# Patient Record
Sex: Female | Born: 1990 | Race: White | Hispanic: No | Smoking: Current some day smoker
Health system: Southern US, Community
[De-identification: ages and names within clinical notes are randomized; demographics above are authoritative.]

## PROBLEM LIST (undated history)

## (undated) DIAGNOSIS — K8020 Calculus of gallbladder without cholecystitis without obstruction: Secondary | ICD-10-CM

## (undated) HISTORY — PX: CHOLECYSTECTOMY OPEN: SUR202

---

## 2015-07-10 ENCOUNTER — Inpatient Hospital Stay (HOSPITAL_COMMUNITY)
Admission: EM | Admit: 2015-07-10 | Discharge: 2015-07-20 | DRG: 441 | Disposition: A | Discharge status: Home / Self Care | Payer: No Typology Code available for payment source | Attending: Diagnostic Radiology | Admitting: Diagnostic Radiology

## 2015-07-10 ENCOUNTER — Encounter (HOSPITAL_COMMUNITY): Payer: Self-pay | Admitting: Emergency Medicine

## 2015-07-10 ENCOUNTER — Emergency Department (HOSPITAL_COMMUNITY): Payer: No Typology Code available for payment source

## 2015-07-10 DIAGNOSIS — S36116S Major laceration of liver, sequela: Secondary | ICD-10-CM

## 2015-07-10 DIAGNOSIS — S36113A Laceration of liver, unspecified degree, initial encounter: Principal | ICD-10-CM

## 2015-07-10 DIAGNOSIS — D62 Acute posthemorrhagic anemia: Secondary | ICD-10-CM | POA: Diagnosis present

## 2015-07-10 DIAGNOSIS — A599 Trichomoniasis, unspecified: Secondary | ICD-10-CM | POA: Diagnosis present

## 2015-07-10 DIAGNOSIS — N390 Urinary tract infection, site not specified: Secondary | ICD-10-CM | POA: Diagnosis present

## 2015-07-10 DIAGNOSIS — S31109S Unspecified open wound of abdominal wall, unspecified quadrant without penetration into peritoneal cavity, sequela: Secondary | ICD-10-CM

## 2015-07-10 DIAGNOSIS — K661 Hemoperitoneum: Secondary | ICD-10-CM | POA: Diagnosis not present

## 2015-07-10 DIAGNOSIS — R17 Unspecified jaundice: Secondary | ICD-10-CM | POA: Diagnosis present

## 2015-07-10 DIAGNOSIS — R509 Fever, unspecified: Secondary | ICD-10-CM

## 2015-07-10 DIAGNOSIS — T1490XA Injury, unspecified, initial encounter: Secondary | ICD-10-CM

## 2015-07-10 DIAGNOSIS — Z781 Physical restraint status: Secondary | ICD-10-CM | POA: Diagnosis not present

## 2015-07-10 DIAGNOSIS — S060X9A Concussion with loss of consciousness of unspecified duration, initial encounter: Secondary | ICD-10-CM | POA: Diagnosis present

## 2015-07-10 DIAGNOSIS — R0602 Shortness of breath: Secondary | ICD-10-CM

## 2015-07-10 DIAGNOSIS — F17200 Nicotine dependence, unspecified, uncomplicated: Secondary | ICD-10-CM | POA: Diagnosis present

## 2015-07-10 DIAGNOSIS — R188 Other ascites: Secondary | ICD-10-CM

## 2015-07-10 DIAGNOSIS — K567 Ileus, unspecified: Secondary | ICD-10-CM

## 2015-07-10 DIAGNOSIS — Y92410 Unspecified street and highway as the place of occurrence of the external cause: Secondary | ICD-10-CM

## 2015-07-10 DIAGNOSIS — S060XAA Concussion with loss of consciousness status unknown, initial encounter: Secondary | ICD-10-CM | POA: Diagnosis present

## 2015-07-10 DIAGNOSIS — R109 Unspecified abdominal pain: Secondary | ICD-10-CM | POA: Diagnosis present

## 2015-07-10 DIAGNOSIS — S36116A Major laceration of liver, initial encounter: Secondary | ICD-10-CM

## 2015-07-10 HISTORY — DX: Calculus of gallbladder without cholecystitis without obstruction: K80.20

## 2015-07-10 LAB — DIFFERENTIAL
Basophils Absolute: 0.1 10*3/uL (ref 0.0–0.1)
Basophils Relative: 0 %
EOS PCT: 1 %
Eosinophils Absolute: 0.2 10*3/uL (ref 0.0–0.7)
LYMPHS ABS: 6.2 10*3/uL — AB (ref 0.7–4.0)
LYMPHS PCT: 20 %
MONO ABS: 1.3 10*3/uL — AB (ref 0.1–1.0)
MONOS PCT: 4 %
Neutro Abs: 22.8 10*3/uL — ABNORMAL HIGH (ref 1.7–7.7)
Neutrophils Relative %: 75 %

## 2015-07-10 LAB — URINALYSIS, ROUTINE W REFLEX MICROSCOPIC
BILIRUBIN URINE: NEGATIVE
GLUCOSE, UA: 100 mg/dL — AB
KETONES UR: NEGATIVE mg/dL
Leukocytes, UA: NEGATIVE
Nitrite: NEGATIVE
PH: 7 (ref 5.0–8.0)
Protein, ur: 30 mg/dL — AB
Specific Gravity, Urine: >1.046 — ABNORMAL HIGH (ref 1.005–1.030)

## 2015-07-10 LAB — COMPREHENSIVE METABOLIC PANEL
ALT: 543 U/L — AB (ref 14–54)
AST: 666 U/L — AB (ref 15–41)
Albumin: 3.5 g/dL (ref 3.5–5.0)
Alkaline Phosphatase: 102 U/L (ref 38–126)
Anion gap: 13 (ref 5–15)
BUN: 14 mg/dL (ref 6–20)
CHLORIDE: 105 mmol/L (ref 101–111)
CO2: 21 mmol/L — AB (ref 22–32)
CREATININE: 1.29 mg/dL — AB (ref 0.44–1.00)
Calcium: 8.5 mg/dL — ABNORMAL LOW (ref 8.9–10.3)
GFR calc Af Amer: >60 mL/min (ref >60)
GFR calc non Af Amer: 57 mL/min — ABNORMAL LOW (ref >60)
GLUCOSE: 306 mg/dL — AB (ref 65–99)
Potassium: 3.5 mmol/L (ref 3.5–5.1)
SODIUM: 139 mmol/L (ref 135–145)
Total Bilirubin: 1 mg/dL (ref 0.3–1.2)
Total Protein: 6.1 g/dL — ABNORMAL LOW (ref 6.5–8.1)

## 2015-07-10 LAB — CBC
HCT: 36.6 % (ref 36.0–46.0)
HEMATOCRIT: 31.5 % — AB (ref 36.0–46.0)
HEMOGLOBIN: 10.2 g/dL — AB (ref 12.0–15.0)
Hemoglobin: 11.5 g/dL — ABNORMAL LOW (ref 12.0–15.0)
MCH: 32 pg (ref 26.0–34.0)
MCH: 33.2 pg (ref 26.0–34.0)
MCHC: 31.4 g/dL (ref 30.0–36.0)
MCHC: 32.4 g/dL (ref 30.0–36.0)
MCV: 101.9 fL — AB (ref 78.0–100.0)
MCV: 102.6 fL — AB (ref 78.0–100.0)
PLATELETS: 1071 10*3/uL — AB (ref 150–400)
Platelets: 270 10*3/uL (ref 150–400)
RBC: 3.59 MIL/uL — ABNORMAL LOW (ref 3.87–5.11)
RBC: 3.07 MIL/uL — ABNORMAL LOW (ref 3.87–5.11)
RDW: 14 % (ref 11.5–15.5)
RDW: 14.1 % (ref 11.5–15.5)
WBC: 30.5 10*3/uL — ABNORMAL HIGH (ref 4.0–10.5)
WBC: 24.5 10*3/uL — AB (ref 4.0–10.5)

## 2015-07-10 LAB — PROTIME-INR
INR: 1.18 (ref 0.00–1.49)
PROTHROMBIN TIME: 15.2 s (ref 11.6–15.2)

## 2015-07-10 LAB — I-STAT CHEM 8, ED
BUN: 17 mg/dL (ref 6–20)
CREATININE: 1.1 mg/dL — AB (ref 0.44–1.00)
Calcium, Ion: 0.99 mmol/L — ABNORMAL LOW (ref 1.12–1.23)
Chloride: 104 mmol/L (ref 101–111)
Glucose, Bld: 294 mg/dL — ABNORMAL HIGH (ref 65–99)
HEMATOCRIT: 40 % (ref 36.0–46.0)
HEMOGLOBIN: 13.6 g/dL (ref 12.0–15.0)
POTASSIUM: 3.6 mmol/L (ref 3.5–5.1)
Sodium: 140 mmol/L (ref 135–145)
TCO2: 23 mmol/L (ref 0–100)

## 2015-07-10 LAB — SAMPLE TO BLOOD BANK

## 2015-07-10 LAB — I-STAT CG4 LACTIC ACID, ED: Lactic Acid, Venous: 5.58 mmol/L (ref 0.5–2.0)

## 2015-07-10 LAB — URINE MICROSCOPIC-ADD ON

## 2015-07-10 LAB — ABO/RH: ABO/RH(D): O POS

## 2015-07-10 LAB — PREPARE RBC (CROSSMATCH)

## 2015-07-10 LAB — CDS SEROLOGY

## 2015-07-10 LAB — ETHANOL: Alcohol, Ethyl (B): <5 mg/dL (ref <5)

## 2015-07-10 MED ORDER — HYDROMORPHONE HCL 1 MG/ML IJ SOLN
INTRAMUSCULAR | Status: AC
  Filled 2015-07-10: qty 1

## 2015-07-10 MED ORDER — DEXTROSE IN LACTATED RINGERS 5 % IV SOLN
INTRAVENOUS | Status: DC
  Administered 2015-07-10 – 2015-07-14 (×5): via INTRAVENOUS

## 2015-07-10 MED ORDER — DOCUSATE SODIUM 100 MG PO CAPS
100.0000 mg | ORAL_CAPSULE | Freq: Two times a day (BID) | ORAL | Status: DC
  Administered 2015-07-11 – 2015-07-20 (×12): 100 mg via ORAL
  Filled 2015-07-10 (×15): qty 1

## 2015-07-10 MED ORDER — ONDANSETRON HCL 4 MG/2ML IJ SOLN
4.0000 mg | Freq: Four times a day (QID) | INTRAMUSCULAR | Status: DC | PRN
  Administered 2015-07-11: 4 mg via INTRAVENOUS
  Filled 2015-07-10: qty 2

## 2015-07-10 MED ORDER — HYDROMORPHONE HCL 1 MG/ML IJ SOLN
1.0000 mg | Freq: Once | INTRAMUSCULAR | Status: AC
  Administered 2015-07-10: 1 mg via INTRAVENOUS

## 2015-07-10 MED ORDER — PANTOPRAZOLE SODIUM 40 MG IV SOLR
40.0000 mg | Freq: Every day | INTRAVENOUS | Status: DC
  Administered 2015-07-10: 40 mg via INTRAVENOUS
  Filled 2015-07-10: qty 40

## 2015-07-10 MED ORDER — MORPHINE SULFATE (PF) 4 MG/ML IV SOLN
4.0000 mg | Freq: Once | INTRAVENOUS | Status: AC
  Administered 2015-07-10: 4 mg via INTRAVENOUS
  Filled 2015-07-10: qty 1

## 2015-07-10 MED ORDER — MORPHINE SULFATE (PF) 4 MG/ML IV SOLN
4.0000 mg | Freq: Once | INTRAVENOUS | Status: AC
Start: 2015-07-10 — End: 2015-07-10
  Administered 2015-07-10: 4 mg via INTRAVENOUS
  Filled 2015-07-10: qty 1

## 2015-07-10 MED ORDER — ONDANSETRON HCL 4 MG/2ML IJ SOLN
4.0000 mg | Freq: Four times a day (QID) | INTRAMUSCULAR | Status: DC | PRN
  Administered 2015-07-12 – 2015-07-15 (×6): 4 mg via INTRAVENOUS
  Filled 2015-07-10 (×6): qty 2

## 2015-07-10 MED ORDER — SODIUM CHLORIDE 0.9 % IV BOLUS (SEPSIS)
1000.0000 mL | Freq: Once | INTRAVENOUS | Status: AC
  Administered 2015-07-10: 1000 mL via INTRAVENOUS

## 2015-07-10 MED ORDER — LORAZEPAM 2 MG/ML IJ SOLN
1.0000 mg | Freq: Once | INTRAMUSCULAR | Status: AC
  Administered 2015-07-10: 1 mg via INTRAVENOUS
  Filled 2015-07-10: qty 1

## 2015-07-10 MED ORDER — ESMOLOL HCL-SODIUM CHLORIDE 2000 MG/100ML IV SOLN
25.0000 ug/kg/min | INTRAVENOUS | Status: DC
  Administered 2015-07-10 – 2015-07-11 (×2): 25 ug/kg/min via INTRAVENOUS
  Filled 2015-07-10 (×2): qty 100

## 2015-07-10 MED ORDER — SODIUM CHLORIDE 0.9% FLUSH: 9.0000 mL | INTRAVENOUS | Status: DC | PRN

## 2015-07-10 MED ORDER — PANTOPRAZOLE SODIUM 40 MG PO TBEC
40.0000 mg | DELAYED_RELEASE_TABLET | Freq: Every day | ORAL | Status: DC
  Administered 2015-07-11 – 2015-07-20 (×9): 40 mg via ORAL
  Filled 2015-07-10 (×9): qty 1

## 2015-07-10 MED ORDER — DIPHENHYDRAMINE HCL 12.5 MG/5ML PO ELIX: 12.5000 mg | ORAL_SOLUTION | Freq: Four times a day (QID) | ORAL | Status: DC | PRN

## 2015-07-10 MED ORDER — IOPAMIDOL (ISOVUE-300) INJECTION 61%
INTRAVENOUS | Status: AC
  Administered 2015-07-10: 100 mL
  Filled 2015-07-10: qty 100

## 2015-07-10 MED ORDER — ONDANSETRON HCL 4 MG PO TABS
4.0000 mg | ORAL_TABLET | Freq: Four times a day (QID) | ORAL | Status: DC | PRN
  Filled 2015-07-10: qty 1

## 2015-07-10 MED ORDER — HYDROMORPHONE HCL 1 MG/ML IJ SOLN
0.5000 mg | INTRAMUSCULAR | Status: DC | PRN
  Administered 2015-07-10 (×2): 2 mg via INTRAVENOUS
  Filled 2015-07-10 (×2): qty 2

## 2015-07-10 MED ORDER — DIPHENHYDRAMINE HCL 50 MG/ML IJ SOLN
12.5000 mg | Freq: Four times a day (QID) | INTRAMUSCULAR | Status: DC | PRN
Start: 2015-07-10 — End: 2015-07-13

## 2015-07-10 MED ORDER — HYDROMORPHONE 1 MG/ML IV SOLN
INTRAVENOUS | Status: DC
  Administered 2015-07-11: 01:00:00 via INTRAVENOUS
  Administered 2015-07-11: 1.5 mg via INTRAVENOUS
  Administered 2015-07-11: 4.5 mg via INTRAVENOUS
  Administered 2015-07-11: 2.6 mg via INTRAVENOUS
  Administered 2015-07-11 (×2): 2.1 mg via INTRAVENOUS
  Administered 2015-07-12: 1.5 mg via INTRAVENOUS
  Administered 2015-07-12: 3.3 mg via INTRAVENOUS
  Administered 2015-07-12: 2.7 mg via INTRAVENOUS
  Administered 2015-07-12: 3.6 mg via INTRAVENOUS
  Administered 2015-07-12: 1.25 mg via INTRAVENOUS
  Administered 2015-07-12: 11:00:00 via INTRAVENOUS
  Administered 2015-07-12 – 2015-07-13 (×2): 2.4 mg via INTRAVENOUS
  Administered 2015-07-13: 2.1 mg via INTRAVENOUS
  Administered 2015-07-13: 4.2 mg via INTRAVENOUS
  Administered 2015-07-13: 1.5 mg via INTRAVENOUS
  Administered 2015-07-13: 6.91 mg via INTRAVENOUS
  Filled 2015-07-10 (×2): qty 25

## 2015-07-10 MED ORDER — NALOXONE HCL 0.4 MG/ML IJ SOLN: 0.4000 mg | INTRAMUSCULAR | Status: DC | PRN

## 2015-07-10 MED ORDER — SODIUM CHLORIDE 0.9 % IV SOLN: 10.0000 mL/h | Freq: Once | INTRAVENOUS | Status: DC

## 2015-07-10 MED ORDER — NICOTINE 21 MG/24HR TD PT24
21.0000 mg | MEDICATED_PATCH | Freq: Once | TRANSDERMAL | Status: AC
  Administered 2015-07-10: 21 mg via TRANSDERMAL
  Filled 2015-07-10: qty 1

## 2015-07-10 NOTE — ED Notes (Signed)
Pt very agitated, RN continuously needed to be encouraged to stay in bed, reports "bad pain"
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
## 2015-07-10 NOTE — ED Notes (Signed)
Pt pulled off C collar. Pt out of bed stating she has to pee. Pt placed back in bed, C collar applied.
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
## 2015-07-10 NOTE — ED Notes (Signed)
Pt returned from CT °

## 2015-07-10 NOTE — Progress Notes (Signed)
F[facility] of [facility] is now in [facility]age...asked when he could visit patient.  RN indicated that he should finish triage and room assignment, then ask to visit with her if possible.  Chaplain communicated information to fiance, who is accompanied by his mother.  Rev. BroseleyJan Hill, [place]Chaplain [phone]
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
## 2015-07-10 NOTE — ED Notes (Signed)
X-ray at bedside

## 2015-07-10 NOTE — ED Notes (Signed)
Pt appears pale at this time. No bruising noted on abdomen.

## 2015-07-10 NOTE — ED Notes (Signed)
Pt in CT.

## 2015-07-10 NOTE — ED Notes (Addendum)
Critical platelet 1071 level. MD notified. Dr. Silverio LayYao notified. Added on Differential.

## 2015-07-10 NOTE — ED Notes (Signed)
Per Dr. Silverio LayYao, to hold giving blood. To type and cross for four units and have them ready

## 2015-07-10 NOTE — ED Notes (Signed)
Attempted to chart Trauma End, unable to at this time.

## 2015-07-11 LAB — CBC
HEMATOCRIT: 33.4 % — AB (ref 36.0–46.0)
HEMATOCRIT: 31.6 % — AB (ref 36.0–46.0)
HEMOGLOBIN: 10.8 g/dL — AB (ref 12.0–15.0)
Hemoglobin: 10.1 g/dL — ABNORMAL LOW (ref 12.0–15.0)
MCH: 32.7 pg (ref 26.0–34.0)
MCH: 32.5 pg (ref 26.0–34.0)
MCHC: 32.3 g/dL (ref 30.0–36.0)
MCHC: 32 g/dL (ref 30.0–36.0)
MCV: 101.2 fL — ABNORMAL HIGH (ref 78.0–100.0)
MCV: 101.6 fL — AB (ref 78.0–100.0)
PLATELETS: 232 10*3/uL (ref 150–400)
Platelets: 191 10*3/uL (ref 150–400)
RBC: 3.3 MIL/uL — ABNORMAL LOW (ref 3.87–5.11)
RBC: 3.11 MIL/uL — AB (ref 3.87–5.11)
RDW: 14.3 % (ref 11.5–15.5)
RDW: 14.5 % (ref 11.5–15.5)
WBC: 19.1 10*3/uL — ABNORMAL HIGH (ref 4.0–10.5)
WBC: 14.7 10*3/uL — AB (ref 4.0–10.5)

## 2015-07-11 LAB — COMPREHENSIVE METABOLIC PANEL
ALK PHOS: 77 U/L (ref 38–126)
ALT: 621 U/L — ABNORMAL HIGH (ref 14–54)
ANION GAP: 7 (ref 5–15)
AST: 701 U/L — ABNORMAL HIGH (ref 15–41)
Albumin: 3.2 g/dL — ABNORMAL LOW (ref 3.5–5.0)
BILIRUBIN TOTAL: 0.6 mg/dL (ref 0.3–1.2)
BUN: 13 mg/dL (ref 6–20)
CALCIUM: 7.9 mg/dL — AB (ref 8.9–10.3)
CO2: 22 mmol/L (ref 22–32)
Chloride: 112 mmol/L — ABNORMAL HIGH (ref 101–111)
Creatinine, Ser: 0.97 mg/dL (ref 0.44–1.00)
GFR calc non Af Amer: >60 mL/min (ref >60)
Glucose, Bld: 187 mg/dL — ABNORMAL HIGH (ref 65–99)
POTASSIUM: 5 mmol/L (ref 3.5–5.1)
Sodium: 141 mmol/L (ref 135–145)
TOTAL PROTEIN: 5.4 g/dL — AB (ref 6.5–8.1)

## 2015-07-11 LAB — MRSA PCR SCREENING: MRSA BY PCR: NEGATIVE

## 2015-07-11 LAB — PROTIME-INR
INR: 1.16 (ref 0.00–1.49)
PROTHROMBIN TIME: 15 s (ref 11.6–15.2)

## 2015-07-11 LAB — PATHOLOGIST SMEAR REVIEW

## 2015-07-11 MED ORDER — METOPROLOL TARTRATE 5 MG/5ML IV SOLN
10.0000 mg | Freq: Four times a day (QID) | INTRAVENOUS | Status: DC
  Administered 2015-07-11 – 2015-07-13 (×10): 10 mg via INTRAVENOUS
  Filled 2015-07-11 (×11): qty 10

## 2015-07-11 MED ORDER — PROCHLORPERAZINE EDISYLATE 5 MG/ML IJ SOLN
5.0000 mg | Freq: Once | INTRAMUSCULAR | Status: AC
  Administered 2015-07-11: 5 mg via INTRAVENOUS
  Filled 2015-07-11: qty 2
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
## 2015-07-12 LAB — BASIC METABOLIC PANEL
Anion gap: 8 (ref 5–15)
BUN: 12 mg/dL (ref 6–20)
CHLORIDE: 106 mmol/L (ref 101–111)
CO2: 22 mmol/L (ref 22–32)
Calcium: 8.2 mg/dL — ABNORMAL LOW (ref 8.9–10.3)
Creatinine, Ser: 0.95 mg/dL (ref 0.44–1.00)
GFR calc Af Amer: >60 mL/min (ref >60)
GFR calc non Af Amer: >60 mL/min (ref >60)
GLUCOSE: 168 mg/dL — AB (ref 65–99)
Potassium: 4.5 mmol/L (ref 3.5–5.1)
Sodium: 136 mmol/L (ref 135–145)

## 2015-07-12 LAB — URINE MICROSCOPIC-ADD ON

## 2015-07-12 LAB — CBC WITH DIFFERENTIAL/PLATELET
Basophils Absolute: 0 10*3/uL (ref 0.0–0.1)
Basophils Relative: 0 %
EOS ABS: 0 10*3/uL (ref 0.0–0.7)
Eosinophils Relative: 0 %
HEMATOCRIT: 27.6 % — AB (ref 36.0–46.0)
HEMOGLOBIN: 8.8 g/dL — AB (ref 12.0–15.0)
LYMPHS ABS: 1.9 10*3/uL (ref 0.7–4.0)
LYMPHS PCT: 13 %
MCH: 32.8 pg (ref 26.0–34.0)
MCHC: 31.9 g/dL (ref 30.0–36.0)
MCV: 103 fL — AB (ref 78.0–100.0)
MONOS PCT: 8 %
Monocytes Absolute: 1.2 10*3/uL — ABNORMAL HIGH (ref 0.1–1.0)
NEUTROS ABS: 11.7 10*3/uL — AB (ref 1.7–7.7)
NEUTROS PCT: 79 %
Platelets: 203 10*3/uL (ref 150–400)
RBC: 2.68 MIL/uL — AB (ref 3.87–5.11)
RDW: 14.6 % (ref 11.5–15.5)
WBC: 14.8 10*3/uL — AB (ref 4.0–10.5)

## 2015-07-12 LAB — CBC
HEMATOCRIT: 26.6 % — AB (ref 36.0–46.0)
Hemoglobin: 8.5 g/dL — ABNORMAL LOW (ref 12.0–15.0)
MCH: 32.7 pg (ref 26.0–34.0)
MCHC: 32 g/dL (ref 30.0–36.0)
MCV: 102.3 fL — AB (ref 78.0–100.0)
PLATELETS: 216 10*3/uL (ref 150–400)
RBC: 2.6 MIL/uL — ABNORMAL LOW (ref 3.87–5.11)
RDW: 14.5 % (ref 11.5–15.5)
WBC: 16.2 10*3/uL — AB (ref 4.0–10.5)

## 2015-07-12 LAB — URINALYSIS, ROUTINE W REFLEX MICROSCOPIC
GLUCOSE, UA: NEGATIVE mg/dL
KETONES UR: 15 mg/dL — AB
Nitrite: POSITIVE — AB
PH: 5.5 (ref 5.0–8.0)
PROTEIN: 30 mg/dL — AB
Specific Gravity, Urine: 1.029 (ref 1.005–1.030)

## 2015-07-12 MED ORDER — SODIUM CHLORIDE 0.9 % IV BOLUS (SEPSIS)
500.0000 mL | Freq: Once | INTRAVENOUS | Status: AC
  Administered 2015-07-12: 500 mL via INTRAVENOUS

## 2015-07-12 NOTE — Progress Notes (Signed)
At shift change (approx. 1930) pt endorsed the urge to urinate but an inability to do so. Bladder Scan indicated 428mL of urine and I and O attempted. 75mL recovered but post cath bladder scan indicated more urine (550mL). Pt still endorsed need to void but desired replacement of Foley due to difficulty with initial I and O Cath (pain, seeming inability to drain bladder). Dr. Davina Pokeornet on-call for Trauma paged and ordered received for Foley. Foley placed per established procedure and drains appropriately but only marginal output (225mL amber, cloudy urine) since placement. Pt no longer endorses desire to void but bladder scan remains in the 400-56800mL range. This RN unable to palpate bladder. Will convey to day RN/day team and continue to monitor.
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
## 2015-07-12 NOTE — Progress Notes (Signed)
@  2145 Dr. Derrell Lollingamirez on-call for Trauma paged regarding pt's decreased UOP (215mL preceding 12 hours, 515mL preceding 24 hours), UA indicating possible UTI and potential need for antibiotics, abdominal distention (mildly worse than my previous shift with pt), and pt's complaints of Indigestion (requesting antacid). Orders received for urine culture (antibiotics deferred until results of culture) and increased fluid rate (4375mL/hr up from 50mL./hr). No other orders received. Will continue to monitor and assess.

## 2015-07-13 ENCOUNTER — Inpatient Hospital Stay (HOSPITAL_COMMUNITY): Payer: No Typology Code available for payment source

## 2015-07-13 LAB — TYPE AND SCREEN
ABO/RH(D): O POS
ANTIBODY SCREEN: NEGATIVE
UNIT DIVISION: 0
UNIT DIVISION: 0
UNIT DIVISION: 0
Unit division: 0

## 2015-07-13 LAB — CBC
HEMATOCRIT: 23.2 % — AB (ref 36.0–46.0)
Hemoglobin: 7.7 g/dL — ABNORMAL LOW (ref 12.0–15.0)
MCH: 33.8 pg (ref 26.0–34.0)
MCHC: 33.2 g/dL (ref 30.0–36.0)
MCV: 101.8 fL — AB (ref 78.0–100.0)
Platelets: 206 10*3/uL (ref 150–400)
RBC: 2.28 MIL/uL — ABNORMAL LOW (ref 3.87–5.11)
RDW: 14.6 % (ref 11.5–15.5)
WBC: 13.2 10*3/uL — ABNORMAL HIGH (ref 4.0–10.5)

## 2015-07-13 LAB — PREPARE RBC (CROSSMATCH)

## 2015-07-13 MED ORDER — FENTANYL 25 MCG/HR TD PT72
50.0000 ug | MEDICATED_PATCH | TRANSDERMAL | Status: DC
  Administered 2015-07-13 – 2015-07-16 (×2): 50 ug via TRANSDERMAL
  Filled 2015-07-13 (×2): qty 2

## 2015-07-13 MED ORDER — SODIUM CHLORIDE 0.9 % IV SOLN
500.0000 mL | Freq: Once | INTRAVENOUS | Status: AC
  Administered 2015-07-13: 500 mL via INTRAVENOUS

## 2015-07-13 MED ORDER — BETHANECHOL CHLORIDE 10 MG PO TABS
10.0000 mg | ORAL_TABLET | Freq: Three times a day (TID) | ORAL | Status: DC
  Administered 2015-07-13 – 2015-07-19 (×18): 10 mg via ORAL
  Filled 2015-07-13 (×18): qty 1

## 2015-07-13 MED ORDER — METOCLOPRAMIDE HCL 5 MG/ML IJ SOLN
5.0000 mg | Freq: Three times a day (TID) | INTRAMUSCULAR | Status: AC | PRN
  Administered 2015-07-13: 5 mg via INTRAVENOUS
  Filled 2015-07-13: qty 2

## 2015-07-13 MED ORDER — MORPHINE SULFATE (PF) 2 MG/ML IV SOLN
2.0000 mg | INTRAVENOUS | Status: DC | PRN
  Administered 2015-07-13 (×2): 2 mg via INTRAVENOUS
  Administered 2015-07-13: 4 mg via INTRAVENOUS
  Filled 2015-07-13: qty 2
  Filled 2015-07-13 (×2): qty 1

## 2015-07-13 MED ORDER — CIPROFLOXACIN IN D5W 400 MG/200ML IV SOLN
400.0000 mg | Freq: Two times a day (BID) | INTRAVENOUS | Status: AC
  Administered 2015-07-13 – 2015-07-16 (×6): 400 mg via INTRAVENOUS
  Filled 2015-07-13 (×6): qty 200

## 2015-07-13 MED ORDER — PROCHLORPERAZINE EDISYLATE 5 MG/ML IJ SOLN
10.0000 mg | INTRAMUSCULAR | Status: DC | PRN
  Administered 2015-07-13: 10 mg via INTRAVENOUS
  Filled 2015-07-13 (×2): qty 2

## 2015-07-13 MED ORDER — SODIUM CHLORIDE 0.9 % IV SOLN
Freq: Once | INTRAVENOUS | Status: AC
  Administered 2015-07-13: 17:00:00 via INTRAVENOUS

## 2015-07-13 MED ORDER — CIPROFLOXACIN IN D5W 400 MG/200ML IV SOLN
400.0000 mg | Freq: Two times a day (BID) | INTRAVENOUS | Status: DC
  Filled 2015-07-13 (×2): qty 200

## 2015-07-13 MED ORDER — ALBUMIN HUMAN 5 % IV SOLN
25.0000 g | Freq: Once | INTRAVENOUS | Status: AC
  Administered 2015-07-14: 25 g via INTRAVENOUS
  Filled 2015-07-13: qty 250
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
## 2015-07-13 NOTE — Progress Notes (Signed)
Urine output is down significantly, BUN and creatinine are normal.  Bladder scan is high in spite of catheter in place.  Likely scanner detecting fluid outside of the bladder.  The plain abdominal film suggest significant ascites.  I will irrigate the patient's Foley myself, and if that shows proper positioning I will order CT scan of the abdomen for the morning.  She may have a bile leak and may need drainage.  I have re-examined the patient and the catheter is in the right place.  She is intravascularly dry.  I did not irrigate her bladder.  Will give a bolus of saline and albumin  Marta LamasJames O. Gae BonWyatt, III, MD, FACS [phone] Trauma Surgeon

## 2015-07-14 ENCOUNTER — Inpatient Hospital Stay (HOSPITAL_COMMUNITY): Payer: No Typology Code available for payment source

## 2015-07-14 DIAGNOSIS — S060X9A Concussion with loss of consciousness of unspecified duration, initial encounter: Secondary | ICD-10-CM | POA: Diagnosis present

## 2015-07-14 DIAGNOSIS — D62 Acute posthemorrhagic anemia: Secondary | ICD-10-CM | POA: Diagnosis not present

## 2015-07-14 DIAGNOSIS — N390 Urinary tract infection, site not specified: Secondary | ICD-10-CM | POA: Diagnosis not present

## 2015-07-14 DIAGNOSIS — S060XAA Concussion with loss of consciousness status unknown, initial encounter: Secondary | ICD-10-CM | POA: Diagnosis present

## 2015-07-14 DIAGNOSIS — K567 Ileus, unspecified: Secondary | ICD-10-CM | POA: Diagnosis not present

## 2015-07-14 LAB — CBC WITH DIFFERENTIAL/PLATELET
BASOS ABS: 0 10*3/uL (ref 0.0–0.1)
BASOS PCT: 0 %
EOS ABS: 0 10*3/uL (ref 0.0–0.7)
EOS PCT: 0 %
HCT: 23.5 % — ABNORMAL LOW (ref 36.0–46.0)
HEMOGLOBIN: 7.7 g/dL — AB (ref 12.0–15.0)
Lymphocytes Relative: 15 %
Lymphs Abs: 1.2 10*3/uL (ref 0.7–4.0)
MCH: 32.2 pg (ref 26.0–34.0)
MCHC: 32.8 g/dL (ref 30.0–36.0)
MCV: 98.3 fL (ref 78.0–100.0)
Monocytes Absolute: 1.2 10*3/uL — ABNORMAL HIGH (ref 0.1–1.0)
Monocytes Relative: 15 %
NEUTROS PCT: 70 %
Neutro Abs: 5.7 10*3/uL (ref 1.7–7.7)
PLATELETS: 155 10*3/uL (ref 150–400)
RBC: 2.39 MIL/uL — AB (ref 3.87–5.11)
RDW: 18 % — ABNORMAL HIGH (ref 11.5–15.5)
WBC: 8.1 10*3/uL (ref 4.0–10.5)

## 2015-07-14 LAB — BASIC METABOLIC PANEL
Anion gap: 7 (ref 5–15)
BUN: 6 mg/dL (ref 6–20)
CHLORIDE: 97 mmol/L — AB (ref 101–111)
CO2: 26 mmol/L (ref 22–32)
Calcium: 8 mg/dL — ABNORMAL LOW (ref 8.9–10.3)
Creatinine, Ser: 0.63 mg/dL (ref 0.44–1.00)
Glucose, Bld: 126 mg/dL — ABNORMAL HIGH (ref 65–99)
POTASSIUM: 3.4 mmol/L — AB (ref 3.5–5.1)
SODIUM: 130 mmol/L — AB (ref 135–145)

## 2015-07-14 LAB — URINE CULTURE: CULTURE: NO GROWTH

## 2015-07-14 LAB — PREPARE RBC (CROSSMATCH)

## 2015-07-14 MED ORDER — MORPHINE SULFATE (PF) 2 MG/ML IV SOLN
2.0000 mg | INTRAVENOUS | Status: DC | PRN
  Administered 2015-07-14: 4 mg via INTRAVENOUS
  Administered 2015-07-14 (×2): 2 mg via INTRAVENOUS
  Administered 2015-07-14: 4 mg via INTRAVENOUS
  Filled 2015-07-14 (×2): qty 2
  Filled 2015-07-14 (×3): qty 1

## 2015-07-14 MED ORDER — DIATRIZOATE MEGLUMINE & SODIUM 66-10 % PO SOLN
ORAL | Status: AC
  Filled 2015-07-14: qty 30

## 2015-07-14 MED ORDER — MORPHINE SULFATE (PF) 2 MG/ML IV SOLN
2.0000 mg | Freq: Once | INTRAVENOUS | Status: AC
  Administered 2015-07-14: 2 mg via INTRAVENOUS

## 2015-07-14 MED ORDER — POTASSIUM CHLORIDE 2 MEQ/ML IV SOLN
INTRAVENOUS | Status: DC
  Administered 2015-07-14 – 2015-07-18 (×9): via INTRAVENOUS
  Filled 2015-07-14 (×18): qty 1000

## 2015-07-14 MED ORDER — MORPHINE SULFATE (PF) 2 MG/ML IV SOLN
2.0000 mg | INTRAVENOUS | Status: DC | PRN
  Administered 2015-07-14 – 2015-07-17 (×9): 2 mg via INTRAVENOUS
  Filled 2015-07-14 (×9): qty 1

## 2015-07-14 MED ORDER — OXYCODONE HCL 5 MG PO TABS
10.0000 mg | ORAL_TABLET | ORAL | Status: DC | PRN
  Administered 2015-07-14: 20 mg via ORAL
  Administered 2015-07-14 (×3): 15 mg via ORAL
  Administered 2015-07-15 – 2015-07-16 (×6): 20 mg via ORAL
  Administered 2015-07-16: 10 mg via ORAL
  Administered 2015-07-18: 20 mg via ORAL
  Filled 2015-07-14 (×2): qty 3
  Filled 2015-07-14 (×3): qty 4
  Filled 2015-07-14: qty 3
  Filled 2015-07-14 (×6): qty 4

## 2015-07-14 MED ORDER — SODIUM CHLORIDE 0.9 % IV SOLN
Freq: Once | INTRAVENOUS | Status: AC
  Administered 2015-07-14: 13:00:00 via INTRAVENOUS

## 2015-07-14 MED ORDER — IOPAMIDOL (ISOVUE-300) INJECTION 61%
INTRAVENOUS | Status: AC
  Administered 2015-07-14: 100 mL
  Filled 2015-07-14: qty 100

## 2015-07-14 MED ORDER — LIDOCAINE HCL (PF) 1 % IJ SOLN
INTRAMUSCULAR | Status: AC
  Filled 2015-07-14: qty 10

## 2015-07-14 MED ORDER — POLYETHYLENE GLYCOL 3350 17 G PO PACK
17.0000 g | PACK | Freq: Every day | ORAL | Status: DC
  Administered 2015-07-14 – 2015-07-16 (×2): 17 g via ORAL
  Filled 2015-07-14 (×4): qty 1

## 2015-07-14 NOTE — Procedures (Signed)
US guided paracentesis, RLQ.  Removed 2 liters of bloody fluid.  No immediate complication.
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
## 2015-07-15 LAB — CBC
HCT: 28.7 % — ABNORMAL LOW (ref 36.0–46.0)
HEMOGLOBIN: 9.3 g/dL — AB (ref 12.0–15.0)
MCH: 31 pg (ref 26.0–34.0)
MCHC: 32.4 g/dL (ref 30.0–36.0)
MCV: 95.7 fL (ref 78.0–100.0)
Platelets: 193 10*3/uL (ref 150–400)
RBC: 3 MIL/uL — AB (ref 3.87–5.11)
RDW: 17.5 % — ABNORMAL HIGH (ref 11.5–15.5)
WBC: 8.4 10*3/uL (ref 4.0–10.5)

## 2015-07-15 LAB — BASIC METABOLIC PANEL
ANION GAP: 4 — AB (ref 5–15)
BUN: <5 mg/dL — ABNORMAL LOW (ref 6–20)
CHLORIDE: 102 mmol/L (ref 101–111)
CO2: 28 mmol/L (ref 22–32)
Calcium: 8.1 mg/dL — ABNORMAL LOW (ref 8.9–10.3)
Creatinine, Ser: 0.55 mg/dL (ref 0.44–1.00)
Glucose, Bld: 130 mg/dL — ABNORMAL HIGH (ref 65–99)
POTASSIUM: 4 mmol/L (ref 3.5–5.1)
SODIUM: 134 mmol/L — AB (ref 135–145)

## 2015-07-15 LAB — TYPE AND SCREEN
ABO/RH(D): O POS
Antibody Screen: NEGATIVE
UNIT DIVISION: 0
UNIT DIVISION: 0

## 2015-07-15 MED ORDER — LORAZEPAM 2 MG/ML IJ SOLN
1.0000 mg | Freq: Four times a day (QID) | INTRAMUSCULAR | Status: DC
  Administered 2015-07-15: 1 mg via INTRAVENOUS

## 2015-07-15 MED ORDER — TRAMADOL HCL 50 MG PO TABS
100.0000 mg | ORAL_TABLET | Freq: Four times a day (QID) | ORAL | Status: DC
  Administered 2015-07-15 – 2015-07-20 (×18): 100 mg via ORAL
  Filled 2015-07-15 (×19): qty 2

## 2015-07-15 MED ORDER — LORAZEPAM 2 MG/ML IJ SOLN
INTRAMUSCULAR | Status: AC
  Filled 2015-07-15: qty 1
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
## 2015-07-15 NOTE — Progress Notes (Signed)
Pt continues to move freely around the room unassisted and RN continually reminded patient that she is only allowed bathroom privileges. Pt states "I cannot sit still in my room all day". RN educated and will continue to monitor.

## 2015-07-15 NOTE — Progress Notes (Signed)
Bed alarm going off, upon assessment patient trying to get out of bed to go to the bathroom, education given on importance of bedrest with liver laceration. Patient states she understands, and will call for help next time. Bed alarm activated. Will continue to monitor.

## 2015-07-16 LAB — CBC
HCT: 29.4 % — ABNORMAL LOW (ref 36.0–46.0)
HEMOGLOBIN: 9.5 g/dL — AB (ref 12.0–15.0)
MCH: 31.1 pg (ref 26.0–34.0)
MCHC: 32.3 g/dL (ref 30.0–36.0)
MCV: 96.4 fL (ref 78.0–100.0)
PLATELETS: 221 10*3/uL (ref 150–400)
RBC: 3.05 MIL/uL — AB (ref 3.87–5.11)
RDW: 17 % — ABNORMAL HIGH (ref 11.5–15.5)
WBC: 10.1 10*3/uL (ref 4.0–10.5)

## 2015-07-16 MED ORDER — CLOTRIMAZOLE 2 % VA CREA
1.0000 | TOPICAL_CREAM | Freq: Every day | VAGINAL | Status: AC
  Administered 2015-07-16 – 2015-07-17 (×4): 1 via VAGINAL
  Filled 2015-07-16: qty 21

## 2015-07-16 MED ORDER — LORAZEPAM 2 MG/ML IJ SOLN
1.0000 mg | Freq: Four times a day (QID) | INTRAMUSCULAR | Status: DC | PRN
  Administered 2015-07-16: 1 mg via INTRAVENOUS

## 2015-07-16 MED ORDER — LORAZEPAM 2 MG/ML IJ SOLN
2.0000 mg | INTRAMUSCULAR | Status: DC | PRN
  Administered 2015-07-16: 3 mg via INTRAVENOUS
  Administered 2015-07-16 – 2015-07-20 (×6): 2 mg via INTRAVENOUS
  Filled 2015-07-16 (×6): qty 1
  Filled 2015-07-16: qty 2
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
## 2015-07-16 NOTE — Progress Notes (Signed)
Talked with Amina on medical team.  Explained that patient is either asleep and lethargic, or up taking herself off the monitors, going to bathroom unassisted.  Per night shift RN report, patient has been doing this all night, and now this morning.  New orders received.
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
## 2015-07-16 NOTE — Progress Notes (Addendum)
Pt continues to get OOB unassisted and disconnects herself from the monitor. Pt noted to be slurring her words occasionally, yet continues to request PRN pain meds. All prn meds held for now. RN will continue to monitor.

## 2015-07-16 NOTE — Progress Notes (Signed)
Pt continues to disconnect herself from her monitor and repeatedly asks RN when she can have her pain medicine. RN has told patient at 6am repeatedly and patient has no recollection of event. RN will continue to monitor.

## 2015-07-16 NOTE — Progress Notes (Signed)
Patient assisted to bathroom to void.  Pt stated she was feeling anxious, however, no outward signs of anxiety, no tremors noted.  Patient assisted back to bed and immediately fell back to sleep.

## 2015-07-16 NOTE — Progress Notes (Signed)
Chart reviewed.  Pt not able to participate in Ax.  SW to continue to follow.  Providence CrosbyAmanda Hilton Saephan, LCSW Clinical Social Work [phone]

## 2015-07-17 ENCOUNTER — Inpatient Hospital Stay (HOSPITAL_COMMUNITY): Payer: No Typology Code available for payment source

## 2015-07-17 DIAGNOSIS — R509 Fever, unspecified: Secondary | ICD-10-CM

## 2015-07-17 LAB — URINALYSIS, ROUTINE W REFLEX MICROSCOPIC
Glucose, UA: NEGATIVE mg/dL
HGB URINE DIPSTICK: NEGATIVE
KETONES UR: NEGATIVE mg/dL
NITRITE: NEGATIVE
PROTEIN: NEGATIVE mg/dL
SPECIFIC GRAVITY, URINE: 1.007 (ref 1.005–1.030)
pH: 8 (ref 5.0–8.0)

## 2015-07-17 LAB — URINE MICROSCOPIC-ADD ON

## 2015-07-17 LAB — COMPREHENSIVE METABOLIC PANEL
ALT: 285 U/L — AB (ref 14–54)
ANION GAP: 8 (ref 5–15)
AST: 89 U/L — ABNORMAL HIGH (ref 15–41)
Albumin: 2.1 g/dL — ABNORMAL LOW (ref 3.5–5.0)
Alkaline Phosphatase: 127 U/L — ABNORMAL HIGH (ref 38–126)
BUN: <5 mg/dL — ABNORMAL LOW (ref 6–20)
CALCIUM: 8.4 mg/dL — AB (ref 8.9–10.3)
CO2: 25 mmol/L (ref 22–32)
CREATININE: 0.58 mg/dL (ref 0.44–1.00)
Chloride: 97 mmol/L — ABNORMAL LOW (ref 101–111)
GFR calc non Af Amer: >60 mL/min (ref >60)
Glucose, Bld: 91 mg/dL (ref 65–99)
Potassium: 4.2 mmol/L (ref 3.5–5.1)
SODIUM: 130 mmol/L — AB (ref 135–145)
Total Bilirubin: 6.3 mg/dL — ABNORMAL HIGH (ref 0.3–1.2)
Total Protein: 5.2 g/dL — ABNORMAL LOW (ref 6.5–8.1)

## 2015-07-17 LAB — CBC
HCT: 29.2 % — ABNORMAL LOW (ref 36.0–46.0)
Hemoglobin: 9.4 g/dL — ABNORMAL LOW (ref 12.0–15.0)
MCH: 32 pg (ref 26.0–34.0)
MCHC: 32.2 g/dL (ref 30.0–36.0)
MCV: 99.3 fL (ref 78.0–100.0)
PLATELETS: 248 10*3/uL (ref 150–400)
RBC: 2.94 MIL/uL — AB (ref 3.87–5.11)
RDW: 17 % — ABNORMAL HIGH (ref 11.5–15.5)
WBC: 12.6 10*3/uL — AB (ref 4.0–10.5)

## 2015-07-17 LAB — AMMONIA: AMMONIA: 44 umol/L — AB (ref 9–35)

## 2015-07-17 MED ORDER — LACTULOSE 10 GM/15ML PO SOLN
20.0000 g | Freq: Two times a day (BID) | ORAL | Status: DC
  Administered 2015-07-17 – 2015-07-19 (×3): 20 g via ORAL
  Filled 2015-07-17 (×4): qty 30

## 2015-07-17 NOTE — Progress Notes (Signed)
While giving morning meds to patient, I saw one of the two fentanyl patches I placed to the patient's right upper arm and covered with a tegaderm yesterday, in the pocket of the side rail with a partial tegarderm covering with my initials.  When I asked the patient where the other patch was, she stated she "threw it in the trash yesterday".  I specifically asked her if she had swallowed it, and this she denied.  I notified NP Emina Riebock of the situation.
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
## 2015-07-17 NOTE — Progress Notes (Signed)
VASCULAR LAB PRELIMINARY  PRELIMINARY  PRELIMINARY  PRELIMINARY  Bilateral lower extremity venous duplex completed.    Preliminary report:  There is no DVT or SVT noted in the bilateral lower extremities.   Han Vejar, RVT 07/17/2015, 3:16 PM

## 2015-07-18 ENCOUNTER — Inpatient Hospital Stay (HOSPITAL_COMMUNITY): Payer: No Typology Code available for payment source

## 2015-07-18 LAB — COMPREHENSIVE METABOLIC PANEL
ALBUMIN: 2.1 g/dL — AB (ref 3.5–5.0)
ALK PHOS: 146 U/L — AB (ref 38–126)
ALT: 223 U/L — AB (ref 14–54)
AST: 77 U/L — AB (ref 15–41)
Anion gap: 8 (ref 5–15)
CALCIUM: 8.2 mg/dL — AB (ref 8.9–10.3)
CHLORIDE: 99 mmol/L — AB (ref 101–111)
CO2: 24 mmol/L (ref 22–32)
CREATININE: 0.7 mg/dL (ref 0.44–1.00)
GFR calc Af Amer: >60 mL/min (ref >60)
GFR calc non Af Amer: >60 mL/min (ref >60)
GLUCOSE: 130 mg/dL — AB (ref 65–99)
Potassium: 3.7 mmol/L (ref 3.5–5.1)
SODIUM: 131 mmol/L — AB (ref 135–145)
Total Bilirubin: 5.1 mg/dL — ABNORMAL HIGH (ref 0.3–1.2)
Total Protein: 5.7 g/dL — ABNORMAL LOW (ref 6.5–8.1)

## 2015-07-18 LAB — CBC
HCT: 31.9 % — ABNORMAL LOW (ref 36.0–46.0)
HEMOGLOBIN: 10.1 g/dL — AB (ref 12.0–15.0)
MCH: 31.3 pg (ref 26.0–34.0)
MCHC: 31.7 g/dL (ref 30.0–36.0)
MCV: 98.8 fL (ref 78.0–100.0)
PLATELETS: 222 10*3/uL (ref 150–400)
RBC: 3.23 MIL/uL — AB (ref 3.87–5.11)
RDW: 16.9 % — ABNORMAL HIGH (ref 11.5–15.5)
WBC: 15.4 10*3/uL — AB (ref 4.0–10.5)

## 2015-07-18 LAB — AMMONIA: Ammonia: 40 umol/L — ABNORMAL HIGH (ref 9–35)

## 2015-07-18 MED ORDER — MAGIC MOUTHWASH
5.0000 mL | Freq: Three times a day (TID) | ORAL | Status: DC | PRN
  Administered 2015-07-18: 5 mL via ORAL
  Filled 2015-07-18: qty 5

## 2015-07-18 MED ORDER — IOPAMIDOL (ISOVUE-300) INJECTION 61%
INTRAVENOUS | Status: AC
  Administered 2015-07-18: 100 mL
  Filled 2015-07-18: qty 100

## 2015-07-18 MED ORDER — ENSURE ENLIVE PO LIQD
237.0000 mL | Freq: Three times a day (TID) | ORAL | Status: DC
  Administered 2015-07-18 – 2015-07-20 (×4): 237 mL via ORAL

## 2015-07-18 MED ORDER — DIATRIZOATE MEGLUMINE & SODIUM 66-10 % PO SOLN
ORAL | Status: AC
  Administered 2015-07-18: 11:00:00
  Filled 2015-07-18: qty 30

## 2015-07-18 MED ORDER — TRAZODONE HCL 100 MG PO TABS
100.0000 mg | ORAL_TABLET | Freq: Every day | ORAL | Status: DC
  Administered 2015-07-18 – 2015-07-19 (×2): 100 mg via ORAL
  Filled 2015-07-18 (×2): qty 1
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
## 2015-07-19 LAB — CBC
HCT: 32.2 % — ABNORMAL LOW (ref 36.0–46.0)
HEMOGLOBIN: 10.1 g/dL — AB (ref 12.0–15.0)
MCH: 30.6 pg (ref 26.0–34.0)
MCHC: 31.4 g/dL (ref 30.0–36.0)
MCV: 97.6 fL (ref 78.0–100.0)
Platelets: 327 10*3/uL (ref 150–400)
RBC: 3.3 MIL/uL — ABNORMAL LOW (ref 3.87–5.11)
RDW: 16.7 % — ABNORMAL HIGH (ref 11.5–15.5)
WBC: 14.7 10*3/uL — ABNORMAL HIGH (ref 4.0–10.5)

## 2015-07-19 LAB — COMPREHENSIVE METABOLIC PANEL
ALK PHOS: 146 U/L — AB (ref 38–126)
ALT: 166 U/L — ABNORMAL HIGH (ref 14–54)
ANION GAP: 9 (ref 5–15)
AST: 55 U/L — ABNORMAL HIGH (ref 15–41)
Albumin: 2.1 g/dL — ABNORMAL LOW (ref 3.5–5.0)
BUN: <5 mg/dL — ABNORMAL LOW (ref 6–20)
CALCIUM: 8.5 mg/dL — AB (ref 8.9–10.3)
CO2: 26 mmol/L (ref 22–32)
Chloride: 98 mmol/L — ABNORMAL LOW (ref 101–111)
Creatinine, Ser: 0.76 mg/dL (ref 0.44–1.00)
Glucose, Bld: 102 mg/dL — ABNORMAL HIGH (ref 65–99)
Potassium: 3.8 mmol/L (ref 3.5–5.1)
SODIUM: 133 mmol/L — AB (ref 135–145)
TOTAL PROTEIN: 5.9 g/dL — AB (ref 6.5–8.1)
Total Bilirubin: 3.5 mg/dL — ABNORMAL HIGH (ref 0.3–1.2)

## 2015-07-19 LAB — URINE MICROSCOPIC-ADD ON

## 2015-07-19 LAB — URINALYSIS, ROUTINE W REFLEX MICROSCOPIC
Glucose, UA: NEGATIVE mg/dL
Hgb urine dipstick: NEGATIVE
KETONES UR: NEGATIVE mg/dL
NITRITE: NEGATIVE
PH: 7.5 (ref 5.0–8.0)
PROTEIN: NEGATIVE mg/dL
Specific Gravity, Urine: 1.015 (ref 1.005–1.030)

## 2015-07-19 MED ORDER — ALPRAZOLAM 0.5 MG PO TABS
0.5000 mg | ORAL_TABLET | Freq: Three times a day (TID) | ORAL | Status: DC | PRN
  Administered 2015-07-19 (×2): 0.5 mg via ORAL
  Filled 2015-07-19 (×2): qty 1

## 2015-07-19 MED ORDER — OXYCODONE HCL 5 MG PO TABS
5.0000 mg | ORAL_TABLET | ORAL | Status: DC | PRN
  Administered 2015-07-19 – 2015-07-20 (×7): 10 mg via ORAL
  Filled 2015-07-19 (×8): qty 2

## 2015-07-19 MED ORDER — METRONIDAZOLE 500 MG PO TABS
2000.0000 mg | ORAL_TABLET | Freq: Once | ORAL | Status: AC
  Administered 2015-07-19: 2000 mg via ORAL
  Filled 2015-07-19: qty 4
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
## 2015-07-19 NOTE — Progress Notes (Signed)
Calorie Count Note  Diet:  Soft with thin liquids Supplements: Ensure Enlive TID between meals  Estimated Nutritional Needs:  Kcal: 1700-1900 Protein: 80-95 gm Fluid: 2 L  Dinner (5/29): 250 kcal, 16 gm protein Breakfast (5/30): 230 kcal, 0 gm protein Supplements: 700 kcal, 40 gm protein  Total intake (2 meal time frame): 1180 kcal (70% of minimum estimated needs)  56 gm protein (70% of minimum estimated needs)  Suspect intake of meals and supplements is adequate to meet nutrition needs. Above intake of 2 meals and 2 supplements meets 70% of estimated needs.  Nutrition Dx: Inadequate oral intake related to poor appetite as evidenced by meal completion < 50%. Progressing.  Goal: Intake to meet >90% of estimated nutrition needs. Met.  Intervention:   Continue Ensure Enlive PO TID.  Discontinue calorie count, intake of meals and supplements is adequate.  Molli Barrows, RD, LDN, [place] Pager [phone] After Hours Pager [phone]

## 2015-07-19 NOTE — Progress Notes (Signed)
  Pt admitted to the unit. Pt is stable, alert and oriented per baseline. Oriented to room, staff, and call bell. Educated to call for any assistance. Bed in lowest position, call bell within reach- will continue to monitor. 

## 2015-07-19 NOTE — Progress Notes (Signed)
Report given to [facility] charge nurse, Warehouse managerZee RN. Pt will transfer shortly.

## 2015-07-19 NOTE — Progress Notes (Signed)
Spoke with PA concerning patient;s HR- no further interventions at this time. Patient has been given pain medication and xanax to help her relax- PA notified of this.
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
## 2015-07-20 LAB — BASIC METABOLIC PANEL
Anion gap: 8 (ref 5–15)
BUN: 8 mg/dL (ref 6–20)
CO2: 28 mmol/L (ref 22–32)
CREATININE: 0.7 mg/dL (ref 0.44–1.00)
Calcium: 8.4 mg/dL — ABNORMAL LOW (ref 8.9–10.3)
Chloride: 95 mmol/L — ABNORMAL LOW (ref 101–111)
GFR calc Af Amer: >60 mL/min (ref >60)
GLUCOSE: 105 mg/dL — AB (ref 65–99)
Potassium: 3.6 mmol/L (ref 3.5–5.1)
SODIUM: 131 mmol/L — AB (ref 135–145)

## 2015-07-20 LAB — CBC WITH DIFFERENTIAL/PLATELET
BAND NEUTROPHILS: 4 %
Eosinophils Relative: 2 %
HCT: 29.4 % — ABNORMAL LOW (ref 36.0–46.0)
Hemoglobin: 9.3 g/dL — ABNORMAL LOW (ref 12.0–15.0)
LYMPHS PCT: 24 %
MCH: 30.5 pg (ref 26.0–34.0)
MCHC: 31.6 g/dL (ref 30.0–36.0)
MCV: 96.4 fL (ref 78.0–100.0)
Monocytes Relative: 6 %
NEUTROS PCT: 64 %
PLATELETS: 307 10*3/uL (ref 150–400)
RBC: 3.05 MIL/uL — ABNORMAL LOW (ref 3.87–5.11)
RDW: 16.4 % — ABNORMAL HIGH (ref 11.5–15.5)
WBC: 10.4 10*3/uL (ref 4.0–10.5)

## 2015-07-20 MED ORDER — TRAMADOL HCL 50 MG PO TABS: 100.0000 mg | ORAL_TABLET | Freq: Four times a day (QID) | ORAL | Status: AC

## 2015-07-20 MED ORDER — OXYCODONE HCL 5 MG PO TABS: 5.0000 mg | ORAL_TABLET | ORAL | Status: AC | PRN

## 2015-07-20 NOTE — Discharge Instructions (Signed)
No running, jumping, ball or contact sports, bikes, skateboards, motorcycles, etc for 3 months. ° °No driving while taking oxycodone. ° °
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
## 2015-07-20 NOTE — Progress Notes (Signed)
Discharge instructions gone over with patient. Home medications gone over. Prescriptions given. Follow up appointment is made. Activity, diet, and things to avoid with liver laceration were discussed. Signs and symptoms of worsening condition discussed. No driving while taking narcotic pain medicine. Patient verbalized understanding of instructions.

## 2015-07-24 LAB — BENZODIAZEPINES,MS,WB/SP RFX
7-AMINOCLONAZEPAM: NEGATIVE ng/mL
Alprazolam: NEGATIVE ng/mL
BENZODIAZEPINES CONFIRM: POSITIVE
CHLORDIAZEPOXIDE: NEGATIVE ng/mL
CLONAZEPAM: NEGATIVE ng/mL
DESALKYLFLURAZEPAM: NEGATIVE ng/mL
DIAZEPAM: NEGATIVE ng/mL
Desmethylchlordiazepoxide: NEGATIVE ng/mL
Desmethyldiazepam: NEGATIVE ng/mL
Flurazepam: NEGATIVE ng/mL
Lorazepam: 26.4 ng/mL
MIDAZOLAM: NEGATIVE ng/mL
Oxazepam: NEGATIVE ng/mL
TRIAZOLAM: NEGATIVE ng/mL
Temazepam: NEGATIVE ng/mL

## 2015-07-26 LAB — THC,MS,WB/SP RFX
CANNABINOL: NEGATIVE ng/mL
Cannabidiol: NEGATIVE ng/mL
Cannabinoid Confirmation: POSITIVE
Carboxy-THC: 2.4 ng/mL
HYDROXY-THC: NEGATIVE ng/mL
Tetrahydrocannabinol(THC): NEGATIVE ng/mL

## 2015-07-27 LAB — OPIATES,MS,WB/SP RFX
6-Acetylmorphine: NEGATIVE
Codeine: NEGATIVE ng/mL
DIHYDROCODEINE: NEGATIVE ng/mL
Hydrocodone: NEGATIVE ng/mL
Hydromorphone: NEGATIVE ng/mL
MORPHINE: NEGATIVE ng/mL
Opiate Confirmation: NEGATIVE

## 2015-07-29 LAB — DRUG SCREEN 10 W/CONF, SERUM
AMPHETAMINES, IA: NEGATIVE ng/mL
Barbiturates, IA: NEGATIVE ug/mL
Benzodiazepines, IA: POSITIVE ng/mL
COCAINE & METABOLITE, IA: NEGATIVE ng/mL
Methadone, IA: NEGATIVE ng/mL
OPIATES, IA: NEGATIVE ng/mL
OXYCODONES, IA: POSITIVE ng/mL
Phencyclidine, IA: NEGATIVE ng/mL
Propoxyphene, IA: NEGATIVE ng/mL
THC(Marijuana) Metabolite, IA: POSITIVE ng/mL

## 2015-07-29 LAB — OXYCODONES,MS,WB/SP RFX
OXYCOCONE: 11.6 ng/mL
OXYCODONES CONFIRMATION: POSITIVE
OXYMORPHONE: NEGATIVE ng/mL
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
